# Patient Record
Sex: Male | Born: 2000 | Race: Black or African American | Hispanic: No | Marital: Single | Smoking: Never smoker
Health system: Southern US, Community
[De-identification: ages and names within clinical notes are randomized; demographics above are authoritative.]

---
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
## 2022-09-23 ENCOUNTER — Ambulatory Visit (INDEPENDENT_AMBULATORY_CARE_PROVIDER_SITE_OTHER): Payer: Self-pay

## 2022-09-23 ENCOUNTER — Encounter (HOSPITAL_COMMUNITY): Payer: Self-pay

## 2022-09-23 ENCOUNTER — Ambulatory Visit (HOSPITAL_COMMUNITY)
Admission: EM | Admit: 2022-09-23 | Discharge: 2022-09-23 | Disposition: A | Discharge status: Home / Self Care | Payer: Self-pay | Attending: Family Medicine | Admitting: Family Medicine

## 2022-09-23 DIAGNOSIS — S62304A Unspecified fracture of fourth metacarpal bone, right hand, initial encounter for closed fracture: Secondary | ICD-10-CM

## 2022-09-23 DIAGNOSIS — Z23 Encounter for immunization: Secondary | ICD-10-CM

## 2022-09-23 DIAGNOSIS — M79641 Pain in right hand: Secondary | ICD-10-CM

## 2022-09-23 MED ORDER — IBUPROFEN 800 MG PO TABS
800.0000 mg | ORAL_TABLET | Freq: Once | ORAL | Status: AC
  Administered 2022-09-23: 800 mg via ORAL

## 2022-09-23 MED ORDER — TETANUS-DIPHTH-ACELL PERTUSSIS 5-2.5-18.5 LF-MCG/0.5 IM SUSY
PREFILLED_SYRINGE | INTRAMUSCULAR | Status: AC
  Filled 2022-09-23: qty 0.5

## 2022-09-23 MED ORDER — IBUPROFEN 800 MG PO TABS
ORAL_TABLET | ORAL | Status: AC
  Filled 2022-09-23: qty 1

## 2022-09-23 MED ORDER — TETANUS-DIPHTH-ACELL PERTUSSIS 5-2.5-18.5 LF-MCG/0.5 IM SUSY
0.5000 mL | PREFILLED_SYRINGE | Freq: Once | INTRAMUSCULAR | Status: AC
  Administered 2022-09-23: 0.5 mL via INTRAMUSCULAR

## 2022-09-23 MED ORDER — HYDROCODONE-ACETAMINOPHEN 5-325 MG PO TABS: 2.0000 | ORAL_TABLET | ORAL | 0 refills | Status: AC | PRN

## 2022-09-23 NOTE — Discharge Instructions (Signed)
You have fractured your metacarpal of your fourth finger.  We have applied a splint today in clinic.  Please follow-up with [facility], the hand specialist, early next week for reevaluation.  You can alternate between Tylenol and ibuprofen every 4-6 hours for pain and inflammation, you can also consider taking the Norco.  This may cause drowsiness and constipation as it is a narcotic.  Do not drink or drive on this medication.  Seek immediate care if you develop fever, numbness, tingling, or any new concerning changes.

## 2022-09-23 NOTE — ED Notes (Signed)
Phone number entered in the chart incorrectly. Patient now back in lobby to be seen. Was in the car waiting.

## 2022-09-23 NOTE — ED Notes (Signed)
No answer x 1 for room.

## 2022-09-23 NOTE — ED Notes (Signed)
Ortho tech called 
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
## 2022-09-23 NOTE — Progress Notes (Signed)
Orthopedic Tech Progress Note Patient Details:  Bobby Martinez 10-13-2000 [id number]  Ortho Devices Type of Ortho Device: Ulna gutter splint Ortho Device/Splint Location: RUE Ortho Device/Splint Interventions: Ordered, Application, Adjustment   Post Interventions Patient Tolerated: Fair Instructions Provided: Care of device  [name] [name] 09/23/2022, 8:24 PM

## 2022-09-23 NOTE — ED Notes (Signed)
Called pt on number in notes and the number isnt available. Called pt from lobby no answe
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
## 2022-11-14 ENCOUNTER — Ambulatory Visit (HOSPITAL_COMMUNITY)
Admission: RE | Admit: 2022-11-14 | Discharge: 2022-11-14 | Disposition: A | Discharge status: Home / Self Care | Payer: Self-pay | Source: Ambulatory Visit

## 2022-11-14 ENCOUNTER — Encounter (HOSPITAL_COMMUNITY): Payer: Self-pay

## 2022-11-14 NOTE — ED Triage Notes (Signed)
Pt states that he is here for removal of his cast that is on his right arm.  Pt states that the cast was put on by [facility].

## 2022-11-14 NOTE — ED Provider Notes (Signed)
Patient presents today for cast removal.  Reports [facility] put the cast on.  Reports did not want to pay the amount to take the cast off.   I recommended he follow up with Orthopedic provider to discuss cast removal; patient in agreement with plan and left urgent care prior to AVS   Valentino Nose, NP 11/14/22 1451
